# Patient Record
Sex: Female | Born: 1955 | Race: White | Hispanic: No | Marital: Married
Health system: Southern US, Community
[De-identification: ages and names within clinical notes are randomized; demographics above are authoritative.]

## PROBLEM LIST (undated history)

## (undated) HISTORY — PX: BREAST EXCISIONAL BIOPSY: SUR124

---

## 2001-04-20 ENCOUNTER — Encounter: Admission: RE | Admit: 2001-04-20 | Discharge: 2001-04-20 | Payer: Self-pay | Admitting: Gynecology

## 2001-04-20 ENCOUNTER — Encounter: Payer: Self-pay | Admitting: Gynecology

## 2003-03-17 ENCOUNTER — Other Ambulatory Visit: Admission: RE | Admit: 2003-03-17 | Discharge: 2003-03-17 | Payer: Self-pay | Admitting: Gynecology

## 2003-04-11 ENCOUNTER — Encounter: Admission: RE | Admit: 2003-04-11 | Discharge: 2003-04-11 | Payer: Self-pay | Admitting: Gynecology

## 2003-04-11 ENCOUNTER — Encounter: Payer: Self-pay | Admitting: Gynecology

## 2004-03-19 ENCOUNTER — Other Ambulatory Visit: Admission: RE | Admit: 2004-03-19 | Discharge: 2004-03-19 | Payer: Self-pay | Admitting: Gynecology

## 2004-04-20 ENCOUNTER — Encounter: Admission: RE | Admit: 2004-04-20 | Discharge: 2004-04-20 | Payer: Self-pay | Admitting: Gynecology

## 2004-04-26 ENCOUNTER — Encounter: Admission: RE | Admit: 2004-04-26 | Discharge: 2004-04-26 | Payer: Self-pay | Admitting: Gynecology

## 2004-10-24 ENCOUNTER — Encounter: Admission: RE | Admit: 2004-10-24 | Discharge: 2004-10-24 | Payer: Self-pay | Admitting: Gynecology

## 2005-04-23 ENCOUNTER — Encounter: Admission: RE | Admit: 2005-04-23 | Discharge: 2005-04-23 | Payer: Self-pay | Admitting: Gynecology

## 2005-06-04 ENCOUNTER — Other Ambulatory Visit: Admission: RE | Admit: 2005-06-04 | Discharge: 2005-06-04 | Payer: Self-pay | Admitting: Gynecology

## 2005-12-09 ENCOUNTER — Encounter: Admission: RE | Admit: 2005-12-09 | Discharge: 2005-12-09 | Payer: Self-pay | Admitting: Family Medicine

## 2006-01-09 ENCOUNTER — Encounter: Admission: RE | Admit: 2006-01-09 | Discharge: 2006-01-09 | Payer: Self-pay | Admitting: Family Medicine

## 2006-04-25 ENCOUNTER — Encounter: Admission: RE | Admit: 2006-04-25 | Discharge: 2006-04-25 | Payer: Self-pay | Admitting: Gynecology

## 2006-05-05 ENCOUNTER — Encounter: Admission: RE | Admit: 2006-05-05 | Discharge: 2006-05-05 | Payer: Self-pay | Admitting: Gynecology

## 2006-05-21 ENCOUNTER — Other Ambulatory Visit: Admission: RE | Admit: 2006-05-21 | Discharge: 2006-05-21 | Payer: Self-pay | Admitting: Gynecology

## 2006-08-15 ENCOUNTER — Encounter: Admission: RE | Admit: 2006-08-15 | Discharge: 2006-08-15 | Payer: Self-pay | Admitting: Family Medicine

## 2007-05-18 ENCOUNTER — Encounter: Admission: RE | Admit: 2007-05-18 | Discharge: 2007-05-18 | Payer: Self-pay | Admitting: Gynecology

## 2007-05-18 ENCOUNTER — Other Ambulatory Visit: Admission: RE | Admit: 2007-05-18 | Discharge: 2007-05-18 | Payer: Self-pay | Admitting: Gynecology

## 2008-04-22 ENCOUNTER — Encounter: Admission: RE | Admit: 2008-04-22 | Discharge: 2008-04-22 | Payer: Self-pay | Admitting: Family Medicine

## 2008-05-30 ENCOUNTER — Encounter: Admission: RE | Admit: 2008-05-30 | Discharge: 2008-05-30 | Payer: Self-pay | Admitting: Gynecology

## 2008-06-01 ENCOUNTER — Other Ambulatory Visit: Admission: RE | Admit: 2008-06-01 | Discharge: 2008-06-01 | Payer: Self-pay | Admitting: Gynecology

## 2008-06-08 ENCOUNTER — Encounter: Admission: RE | Admit: 2008-06-08 | Discharge: 2008-06-08 | Payer: Self-pay | Admitting: Gynecology

## 2008-08-08 ENCOUNTER — Encounter (INDEPENDENT_AMBULATORY_CARE_PROVIDER_SITE_OTHER): Payer: Self-pay | Admitting: Obstetrics and Gynecology

## 2008-08-08 ENCOUNTER — Ambulatory Visit (HOSPITAL_COMMUNITY): Admission: RE | Admit: 2008-08-08 | Discharge: 2008-08-08 | Payer: Self-pay | Admitting: Obstetrics and Gynecology

## 2009-06-14 ENCOUNTER — Encounter: Admission: RE | Admit: 2009-06-14 | Discharge: 2009-06-14 | Payer: Self-pay | Admitting: Gynecology

## 2009-08-09 ENCOUNTER — Encounter: Admission: RE | Admit: 2009-08-09 | Discharge: 2009-08-09 | Payer: Self-pay | Admitting: Gynecology

## 2010-06-28 ENCOUNTER — Encounter
Admission: RE | Admit: 2010-06-28 | Discharge: 2010-06-28 | Payer: Self-pay | Source: Home / Self Care | Admitting: Gynecology

## 2010-07-29 ENCOUNTER — Emergency Department (HOSPITAL_COMMUNITY)
Admission: EM | Admit: 2010-07-29 | Discharge: 2010-07-29 | Payer: Self-pay | Source: Home / Self Care | Admitting: Emergency Medicine

## 2010-08-06 LAB — CBC
HCT: 35.2 % — ABNORMAL LOW (ref 36.0–46.0)
Hemoglobin: 11.4 g/dL — ABNORMAL LOW (ref 12.0–15.0)
MCH: 24.7 pg — ABNORMAL LOW (ref 26.0–34.0)
MCHC: 32.4 g/dL (ref 30.0–36.0)
MCV: 76.4 fL — ABNORMAL LOW (ref 78.0–100.0)
Platelets: 308 10*3/uL (ref 150–400)
RBC: 4.61 MIL/uL (ref 3.87–5.11)
RDW: 14.8 % (ref 11.5–15.5)
WBC: 9.4 10*3/uL (ref 4.0–10.5)

## 2010-08-06 LAB — BASIC METABOLIC PANEL
BUN: 10 mg/dL (ref 6–23)
CO2: 26 mEq/L (ref 19–32)
Calcium: 8.4 mg/dL (ref 8.4–10.5)
Chloride: 107 mEq/L (ref 96–112)
Creatinine, Ser: 0.74 mg/dL (ref 0.4–1.2)
GFR calc Af Amer: >60 mL/min (ref >60)
GFR calc non Af Amer: >60 mL/min (ref >60)
Glucose, Bld: 110 mg/dL — ABNORMAL HIGH (ref 70–99)
Potassium: 4.1 mEq/L (ref 3.5–5.1)
Sodium: 137 mEq/L (ref 135–145)

## 2010-08-11 ENCOUNTER — Encounter: Payer: Self-pay | Admitting: Gynecology

## 2010-11-05 LAB — CBC
HCT: 33.3 % — ABNORMAL LOW (ref 36.0–46.0)
Hemoglobin: 11 g/dL — ABNORMAL LOW (ref 12.0–15.0)
MCHC: 32.9 g/dL (ref 30.0–36.0)
MCV: 79.1 fL (ref 78.0–100.0)
Platelets: 297 10*3/uL (ref 150–400)
RBC: 4.21 MIL/uL (ref 3.87–5.11)
RDW: 14.3 % (ref 11.5–15.5)
WBC: 7 10*3/uL (ref 4.0–10.5)

## 2010-12-04 NOTE — H&P (Signed)
NAME:  Carmen Hurst, Carmen Hurst             ACCOUNT NO.:  [id number]   MEDICAL RECORD NO.:  [id number]          PATIENT TYPE:  AMB   LOCATION:                                FACILITY:  [facility]   PHYSICIAN:  Duke Salvia. Marcelle Overlie, M.D.DATE OF BIRTH:  1955/10/22   DATE OF ADMISSION:  08/08/2008  DATE OF DISCHARGE:                              HISTORY & PHYSICAL   Date of scheduled surgery in 08/08/2008 at [facility].   CHIEF COMPLAINT:  Perimenopausal bleeding, endometrial polyps.   HISTORY OF PRESENT ILLNESS:  A 55 year old G2, P2, her husband has had a  vasectomy, who was evaluated recently by Dr. Teodora Medici with [facility] that  showed recurrence of endometrial polyps, which she had had removed by  [facility] previously.  She presents now for [facility] hysteroscopy.  She has  declined ablation at the same time.  This procedure including risks  related to bleeding, infection, perforation that may require additional  surgery, all reviewed with her, which she understands and accepts.   PAST MEDICAL HISTORY:  Two breast biopsies for benign disease, cesarean  section x2, rotator cuff surgery in 2007, hysteroscopy D&C in 2005.   Her PCP is Dr. Lupe Carney.   REVIEW OF SYSTEMS:  Significant for seasonal allergy.   FAMILY HISTORY:  Significant for both parents on oral control diabetes  treatment.   PHYSICAL EXAMINATION:  VITAL SIGNS:  Temperature 98.2, blood pressure  120/78.  HEENT: Unremarkable.  NECK:  Supple without masses.  LUNGS:  Clear.  CARDIOVASCULAR:  Regular rate and rhythm without murmurs, rubs, gallops.  BREASTS:  Without masses.  ABDOMEN:  Soft, flat, and nontender.  PELVIC:  Normal external genitalia.  High vaginal swab is clear.  Uterus  is mid in size.  Adnexa negative.  EXTREMITIES:  Unremarkable.  NEUROLOGIC:  Unremarkable.   IMPRESSION:  Recurrent endometrial polyps with abnormal uterine  bleeding.   PLAN:  D&C hysteroscopy procedure and risks reviewed as above.      Richard M.  Marcelle Overlie, M.D.  Electronically Signed     [facility]/MED[facility]  D:  08/03/2008  T:  08/03/2008  Job:  [id number]

## 2010-12-04 NOTE — Op Note (Signed)
NAME:  [name], Carmen Hurst             ACCOUNT NO.:  [id number]   MEDICAL RECORD NO.:  [id number]          PATIENT TYPE:  AMB   LOCATION:  [facility]                           FACILITY:  [facility]   PHYSICIAN:  Duke Salvia. Marcelle Overlie, M.D.DATE OF BIRTH:  March 22, 1956   DATE OF PROCEDURE:  DATE OF DISCHARGE:                               OPERATIVE REPORT   PREOPERATIVE DIAGNOSES:  Perimenopausal bleeding, endometrial polyps.   POSTOPERATIVE DIAGNOSES:  Perimenopausal bleeding, endometrial polyps.   PROCEDURE:  D and C and hysteroscopy.   SURGEON:  Duke Salvia. Marcelle Overlie, MD   ANESTHESIA:  General.   COMPLICATIONS:  None.   DRAINS:  Foley catheter.   BLOOD LOSS:  Minimal.   SPECIMENS REMOVED:  Endometrial curettings.   PROCEDURE AND FINDINGS:  The patient was then taken to the operating  room after an adequate level of general anesthesia was obtained with the  patient legs in stirrups, the perineum and vagina were prepped and  draped.  The bladder was drained.  EUA carried out.  Uterus was mid  position, upper limit of normal size.  Adnexa negative.  Speculum was  positioned.  Paracervical block created by infiltrating at 3 and 9  o'clock submucosally.  A 5 mL of 1% plain Xylocaine on either side after  negative aspiration.  Moderate cervical stenosis was noted.  This was  gradually dilated after sounding to 9 cm, dilated to a 27 Pratt, 7-mm  continuous flow.  Hysteroscope was then inserted.  Three to four  distinct benign-appearing polyps were noted on the right and anterior  uterine wall.  Sharp curettage was carried out.  A moderate amount of  tissue was removed.  Exploration with the polyp forceps revealed several  other polypoid pieces of tissue.  Scope was reinserted.  The cavity was  irrigated.  Repeat curettage for one area of some tissue buildup, this  allowed to the cavity to appear clean on followup examination.  All  tissue was submitted to Pathology.  She tolerated this well, went to  recovery room in good condition.      Richard M. Marcelle Overlie, M.D.  Electronically Signed     [facility]/MEDQ  D:  08/08/2008  T:  08/09/2008  Job:  [id number]

## 2011-07-02 ENCOUNTER — Other Ambulatory Visit: Payer: Self-pay | Admitting: Gynecology

## 2011-07-02 DIAGNOSIS — Z1231 Encounter for screening mammogram for malignant neoplasm of breast: Secondary | ICD-10-CM

## 2011-08-02 ENCOUNTER — Ambulatory Visit
Admission: RE | Admit: 2011-08-02 | Discharge: 2011-08-02 | Disposition: A | Discharge status: Home / Self Care | Payer: 59 | Source: Ambulatory Visit | Attending: Gynecology | Admitting: Gynecology

## 2011-08-02 DIAGNOSIS — Z1231 Encounter for screening mammogram for malignant neoplasm of breast: Secondary | ICD-10-CM

## 2011-08-26 ENCOUNTER — Other Ambulatory Visit: Payer: Self-pay | Admitting: Gynecology

## 2012-07-14 ENCOUNTER — Other Ambulatory Visit: Payer: Self-pay | Admitting: Gynecology

## 2012-07-14 DIAGNOSIS — Z1231 Encounter for screening mammogram for malignant neoplasm of breast: Secondary | ICD-10-CM

## 2012-08-21 ENCOUNTER — Ambulatory Visit
Admission: RE | Admit: 2012-08-21 | Discharge: 2012-08-21 | Disposition: A | Discharge status: Home / Self Care | Payer: 59 | Source: Ambulatory Visit | Attending: Gynecology | Admitting: Gynecology

## 2012-08-21 DIAGNOSIS — Z1231 Encounter for screening mammogram for malignant neoplasm of breast: Secondary | ICD-10-CM

## 2013-08-18 ENCOUNTER — Other Ambulatory Visit: Payer: Self-pay

## 2013-08-18 DIAGNOSIS — Z1231 Encounter for screening mammogram for malignant neoplasm of breast: Secondary | ICD-10-CM

## 2013-09-08 ENCOUNTER — Ambulatory Visit
Admission: RE | Admit: 2013-09-08 | Discharge: 2013-09-08 | Disposition: A | Discharge status: Home / Self Care | Payer: 59 | Source: Ambulatory Visit

## 2013-09-08 DIAGNOSIS — Z1231 Encounter for screening mammogram for malignant neoplasm of breast: Secondary | ICD-10-CM

## 2014-08-30 ENCOUNTER — Other Ambulatory Visit: Payer: Self-pay

## 2014-08-30 DIAGNOSIS — Z1231 Encounter for screening mammogram for malignant neoplasm of breast: Secondary | ICD-10-CM

## 2014-09-02 ENCOUNTER — Encounter (INDEPENDENT_AMBULATORY_CARE_PROVIDER_SITE_OTHER): Payer: Self-pay

## 2014-09-02 ENCOUNTER — Ambulatory Visit
Admission: RE | Admit: 2014-09-02 | Discharge: 2014-09-02 | Disposition: A | Discharge status: Home / Self Care | Payer: 59 | Source: Ambulatory Visit

## 2014-09-02 DIAGNOSIS — Z1231 Encounter for screening mammogram for malignant neoplasm of breast: Secondary | ICD-10-CM

## 2014-10-26 ENCOUNTER — Other Ambulatory Visit: Payer: Self-pay | Admitting: Gynecology

## 2014-10-27 LAB — CYTOLOGY - PAP

## 2015-08-08 ENCOUNTER — Other Ambulatory Visit: Payer: Self-pay

## 2015-08-08 DIAGNOSIS — Z1231 Encounter for screening mammogram for malignant neoplasm of breast: Secondary | ICD-10-CM

## 2015-09-04 ENCOUNTER — Ambulatory Visit
Admission: RE | Admit: 2015-09-04 | Discharge: 2015-09-04 | Disposition: A | Discharge status: Home / Self Care | Payer: 59 | Source: Ambulatory Visit

## 2015-09-04 DIAGNOSIS — Z1231 Encounter for screening mammogram for malignant neoplasm of breast: Secondary | ICD-10-CM

## 2016-07-29 DIAGNOSIS — J069 Acute upper respiratory infection, unspecified: Secondary | ICD-10-CM | POA: Diagnosis not present

## 2016-07-29 DIAGNOSIS — J45909 Unspecified asthma, uncomplicated: Secondary | ICD-10-CM | POA: Diagnosis not present

## 2016-12-19 ENCOUNTER — Other Ambulatory Visit: Payer: Self-pay | Admitting: Obstetrics and Gynecology

## 2016-12-19 DIAGNOSIS — Z1231 Encounter for screening mammogram for malignant neoplasm of breast: Secondary | ICD-10-CM

## 2017-01-02 ENCOUNTER — Ambulatory Visit
Admission: RE | Admit: 2017-01-02 | Discharge: 2017-01-02 | Disposition: A | Discharge status: Home / Self Care | Payer: 59 | Source: Ambulatory Visit | Attending: Obstetrics and Gynecology | Admitting: Obstetrics and Gynecology

## 2017-01-02 DIAGNOSIS — Z1231 Encounter for screening mammogram for malignant neoplasm of breast: Secondary | ICD-10-CM | POA: Diagnosis not present

## 2017-03-20 DIAGNOSIS — H5213 Myopia, bilateral: Secondary | ICD-10-CM | POA: Diagnosis not present

## 2017-04-03 DIAGNOSIS — Z01419 Encounter for gynecological examination (general) (routine) without abnormal findings: Secondary | ICD-10-CM | POA: Diagnosis not present

## 2017-04-09 DIAGNOSIS — Z1329 Encounter for screening for other suspected endocrine disorder: Secondary | ICD-10-CM | POA: Diagnosis not present

## 2017-04-09 DIAGNOSIS — Z13228 Encounter for screening for other metabolic disorders: Secondary | ICD-10-CM | POA: Diagnosis not present

## 2017-04-09 DIAGNOSIS — Z13220 Encounter for screening for lipoid disorders: Secondary | ICD-10-CM | POA: Diagnosis not present

## 2017-12-25 ENCOUNTER — Other Ambulatory Visit: Payer: Self-pay | Admitting: Obstetrics and Gynecology

## 2017-12-25 DIAGNOSIS — Z1231 Encounter for screening mammogram for malignant neoplasm of breast: Secondary | ICD-10-CM

## 2018-02-12 ENCOUNTER — Ambulatory Visit
Admission: RE | Admit: 2018-02-12 | Discharge: 2018-02-12 | Disposition: A | Discharge status: Home / Self Care | Payer: 59 | Source: Ambulatory Visit | Attending: Obstetrics and Gynecology | Admitting: Obstetrics and Gynecology

## 2018-02-12 DIAGNOSIS — Z1231 Encounter for screening mammogram for malignant neoplasm of breast: Secondary | ICD-10-CM

## 2018-04-08 DIAGNOSIS — Z6839 Body mass index (BMI) 39.0-39.9, adult: Secondary | ICD-10-CM | POA: Diagnosis not present

## 2018-04-08 DIAGNOSIS — Z01419 Encounter for gynecological examination (general) (routine) without abnormal findings: Secondary | ICD-10-CM | POA: Diagnosis not present

## 2018-05-25 DIAGNOSIS — S93602A Unspecified sprain of left foot, initial encounter: Secondary | ICD-10-CM | POA: Diagnosis not present

## 2018-06-25 DIAGNOSIS — S93602D Unspecified sprain of left foot, subsequent encounter: Secondary | ICD-10-CM | POA: Diagnosis not present

## 2018-06-30 DIAGNOSIS — M79672 Pain in left foot: Secondary | ICD-10-CM | POA: Diagnosis not present

## 2018-06-30 DIAGNOSIS — M25572 Pain in left ankle and joints of left foot: Secondary | ICD-10-CM | POA: Diagnosis not present

## 2018-07-02 DIAGNOSIS — M25572 Pain in left ankle and joints of left foot: Secondary | ICD-10-CM | POA: Diagnosis not present

## 2018-07-06 DIAGNOSIS — M19072 Primary osteoarthritis, left ankle and foot: Secondary | ICD-10-CM | POA: Diagnosis not present

## 2018-07-06 DIAGNOSIS — S93402A Sprain of unspecified ligament of left ankle, initial encounter: Secondary | ICD-10-CM | POA: Diagnosis not present

## 2018-07-14 DIAGNOSIS — S93402D Sprain of unspecified ligament of left ankle, subsequent encounter: Secondary | ICD-10-CM | POA: Diagnosis not present

## 2019-03-10 ENCOUNTER — Other Ambulatory Visit: Payer: Self-pay | Admitting: Obstetrics and Gynecology

## 2019-03-10 DIAGNOSIS — Z1231 Encounter for screening mammogram for malignant neoplasm of breast: Secondary | ICD-10-CM

## 2019-04-27 ENCOUNTER — Ambulatory Visit: Payer: 59

## 2019-05-24 ENCOUNTER — Other Ambulatory Visit: Payer: Self-pay

## 2019-05-24 ENCOUNTER — Ambulatory Visit
Admission: RE | Admit: 2019-05-24 | Discharge: 2019-05-24 | Disposition: A | Discharge status: Home / Self Care | Payer: BC Managed Care – PPO | Source: Ambulatory Visit | Attending: Obstetrics and Gynecology | Admitting: Obstetrics and Gynecology

## 2019-05-24 DIAGNOSIS — Z1231 Encounter for screening mammogram for malignant neoplasm of breast: Secondary | ICD-10-CM

## 2019-10-14 ENCOUNTER — Ambulatory Visit: Payer: BC Managed Care – PPO | Attending: Internal Medicine

## 2019-10-14 DIAGNOSIS — Z23 Encounter for immunization: Secondary | ICD-10-CM

## 2019-10-14 NOTE — Progress Notes (Signed)
   Covid-19 Vaccination Clinic  Name:  Carmen Hurst    MRN: [id number] DOB: 20-Jul-1956  10/14/2019  Carmen Hurst was observed post Covid-19 immunization for 15 minutes without incident. She was provided with Vaccine Information Sheet and instruction to access the V-Safe system.   Carmen Hurst was instructed to call 911 with any severe reactions post vaccine: Marland Kitchen Difficulty breathing  . Swelling of face and throat  . A fast heartbeat  . A bad rash all over body  . Dizziness and weakness   Immunizations Administered    Name Date Dose VIS Date Route   Pfizer COVID-19 Vaccine 10/14/2019  9:57 AM 0.3 mL 07/02/2019 Intramuscular   Manufacturer: [employer], [employer]   Lot: [id number]   NDC: [address]-5

## 2019-11-10 ENCOUNTER — Ambulatory Visit: Payer: BC Managed Care – PPO | Attending: Internal Medicine

## 2019-11-10 DIAGNOSIS — Z23 Encounter for immunization: Secondary | ICD-10-CM

## 2019-11-10 NOTE — Progress Notes (Signed)
   Covid-19 Vaccination Clinic  Name:  Carmen Hurst    MRN: [id number] DOB: 1955-09-04  11/10/2019  [name] was observed post Covid-19 immunization for 15 minutes without incident. She was provided with Vaccine Information Sheet and instruction to access the V-Safe system.   [name] was instructed to call 911 with any severe reactions post vaccine: Marland Kitchen Difficulty breathing  . Swelling of face and throat  . A fast heartbeat  . A bad rash all over body  . Dizziness and weakness   Immunizations Administered    Name Date Dose VIS Date Route   Pfizer COVID-19 Vaccine 11/10/2019  9:57 AM 0.3 mL 09/15/2018 Intramuscular   Manufacturer: [employer], [employer]   Lot: [id number]   NDC: [address]-4

## 2020-04-27 ENCOUNTER — Other Ambulatory Visit: Payer: Self-pay | Admitting: Obstetrics and Gynecology

## 2020-04-27 DIAGNOSIS — Z1231 Encounter for screening mammogram for malignant neoplasm of breast: Secondary | ICD-10-CM

## 2020-05-25 ENCOUNTER — Ambulatory Visit: Payer: BC Managed Care – PPO

## 2020-08-03 ENCOUNTER — Other Ambulatory Visit: Payer: Self-pay

## 2020-08-03 ENCOUNTER — Ambulatory Visit
Admission: RE | Admit: 2020-08-03 | Discharge: 2020-08-03 | Disposition: A | Discharge status: Home / Self Care | Payer: BC Managed Care – PPO | Source: Ambulatory Visit | Attending: Obstetrics and Gynecology | Admitting: Obstetrics and Gynecology

## 2020-08-03 DIAGNOSIS — Z1231 Encounter for screening mammogram for malignant neoplasm of breast: Secondary | ICD-10-CM

## 2021-07-09 ENCOUNTER — Other Ambulatory Visit: Payer: Self-pay | Admitting: Obstetrics and Gynecology

## 2021-07-09 DIAGNOSIS — Z1231 Encounter for screening mammogram for malignant neoplasm of breast: Secondary | ICD-10-CM

## 2021-08-07 ENCOUNTER — Ambulatory Visit
Admission: RE | Admit: 2021-08-07 | Discharge: 2021-08-07 | Disposition: A | Discharge status: Home / Self Care | Payer: BC Managed Care – PPO | Source: Ambulatory Visit | Attending: Obstetrics and Gynecology | Admitting: Obstetrics and Gynecology

## 2021-08-07 DIAGNOSIS — Z1231 Encounter for screening mammogram for malignant neoplasm of breast: Secondary | ICD-10-CM

## 2022-06-26 DIAGNOSIS — Z01419 Encounter for gynecological examination (general) (routine) without abnormal findings: Secondary | ICD-10-CM | POA: Diagnosis not present

## 2022-06-26 DIAGNOSIS — Z6841 Body Mass Index (BMI) 40.0 and over, adult: Secondary | ICD-10-CM | POA: Diagnosis not present

## 2022-07-09 DIAGNOSIS — M545 Low back pain, unspecified: Secondary | ICD-10-CM | POA: Diagnosis not present

## 2022-07-09 DIAGNOSIS — M25551 Pain in right hip: Secondary | ICD-10-CM | POA: Diagnosis not present

## 2022-07-18 ENCOUNTER — Other Ambulatory Visit: Payer: Self-pay | Admitting: Obstetrics and Gynecology

## 2022-07-18 DIAGNOSIS — Z1231 Encounter for screening mammogram for malignant neoplasm of breast: Secondary | ICD-10-CM

## 2022-07-23 DIAGNOSIS — M25551 Pain in right hip: Secondary | ICD-10-CM | POA: Diagnosis not present

## 2022-07-26 DIAGNOSIS — M25551 Pain in right hip: Secondary | ICD-10-CM | POA: Diagnosis not present

## 2022-07-30 DIAGNOSIS — M25551 Pain in right hip: Secondary | ICD-10-CM | POA: Diagnosis not present

## 2022-08-04 IMAGING — MG MM DIGITAL SCREENING BILAT W/ TOMO AND CAD
6 of 10 series · 6 of 30 positions shown · non-contrast
Comparison: Previous exam(s).

CLINICAL DATA: Screening.

EXAM:
DIGITAL SCREENING BILATERAL MAMMOGRAM WITH TOMOSYNTHESIS AND CAD
TECHNIQUE: Bilateral screening digital craniocaudal and mediolateral oblique
mammograms were obtained. Bilateral screening digital breast
tomosynthesis was performed. The images were evaluated with
computer-aided detection.

[L CC synth-2D]
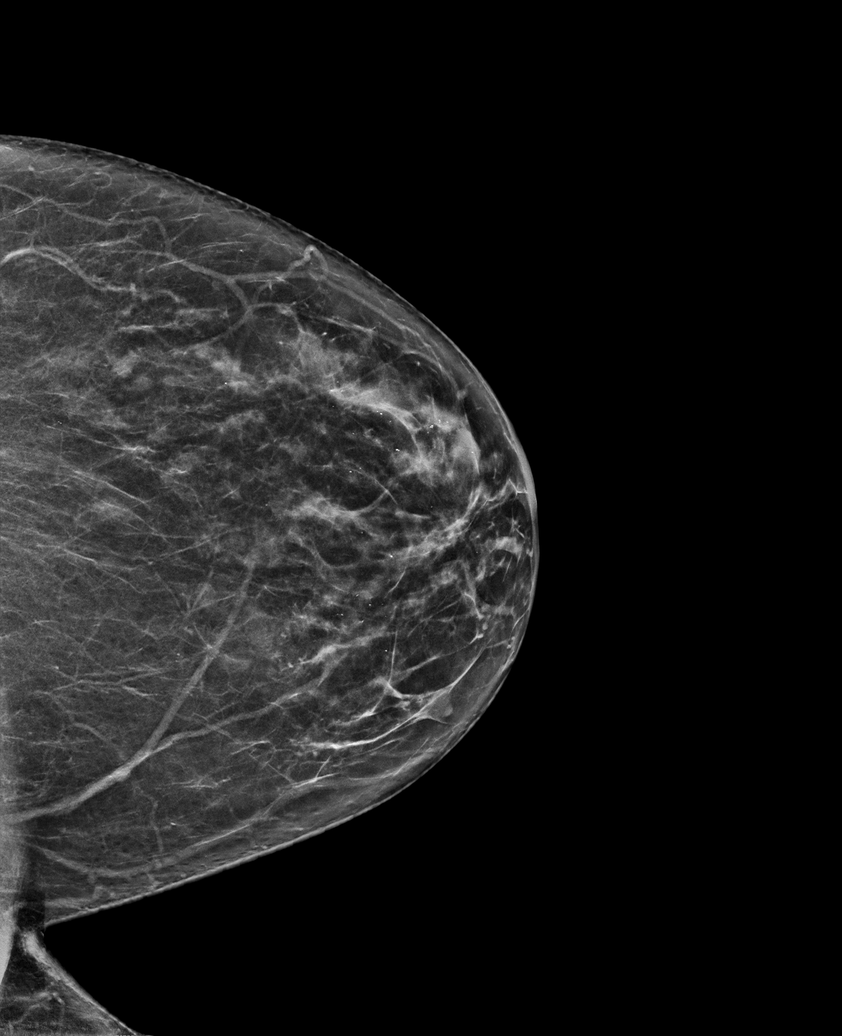

[L MLO synth-2D]
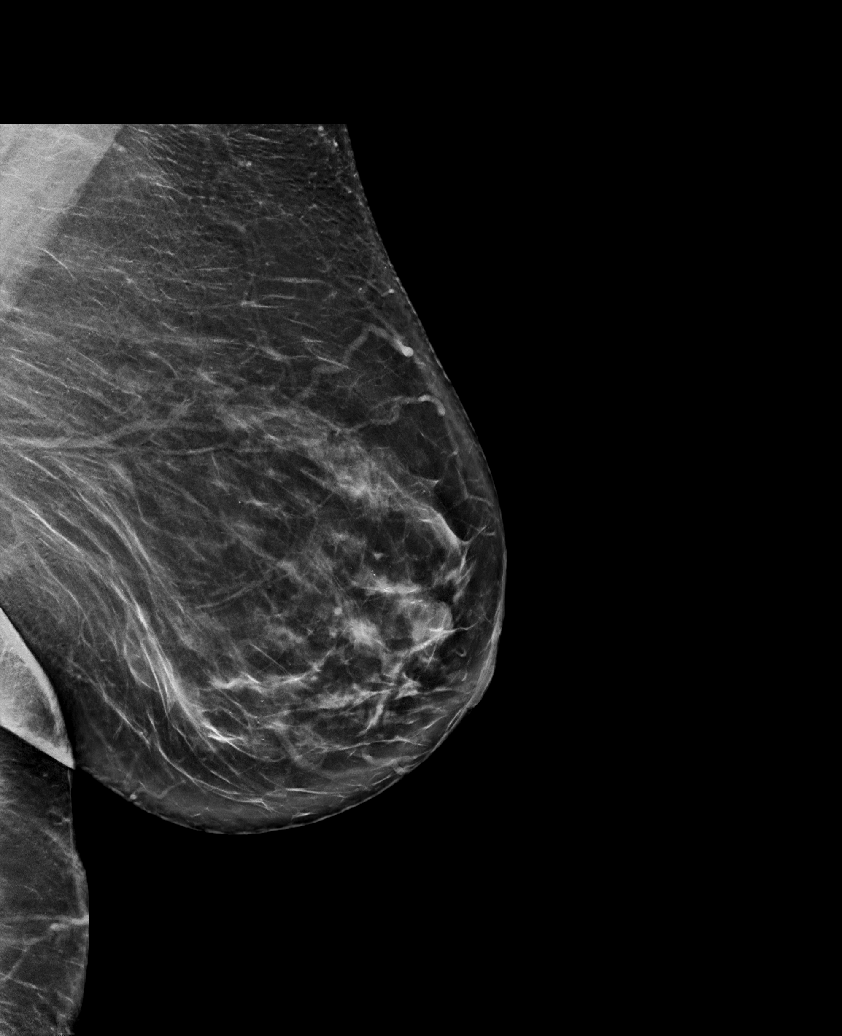

[R MLO synth-2D (1 of 2)]
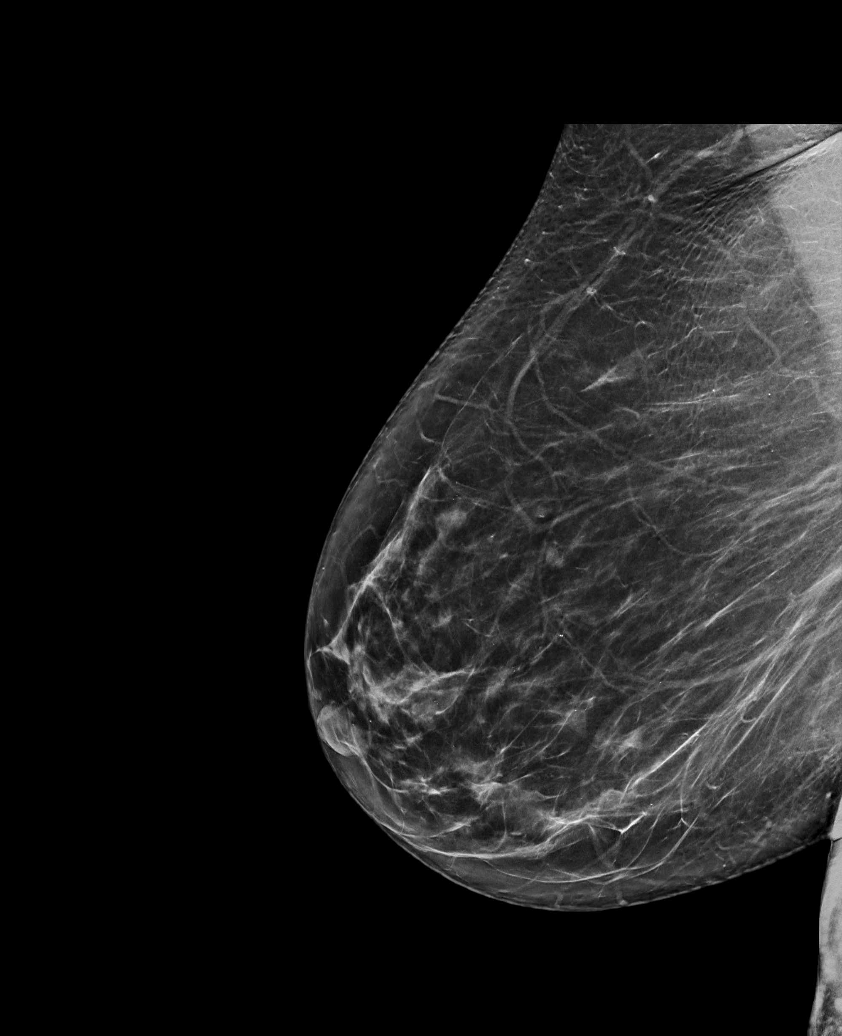

[R MLO synth-2D (2 of 2)]
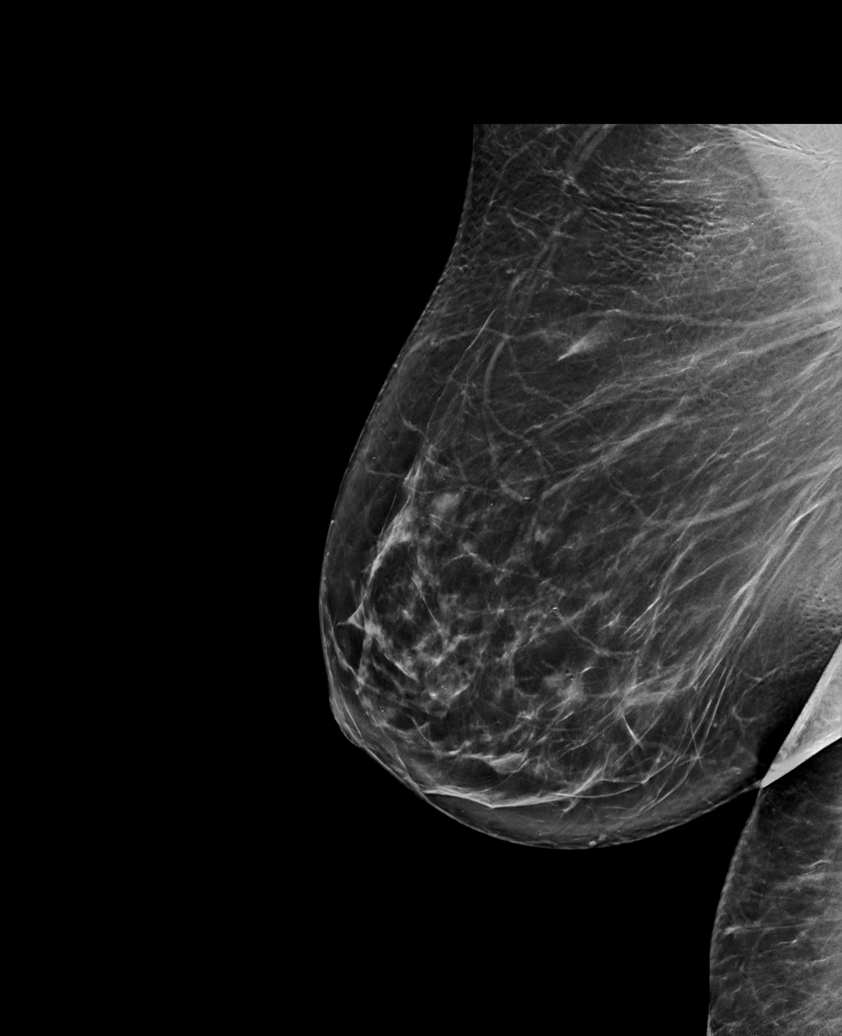

[R CC synth-2D]
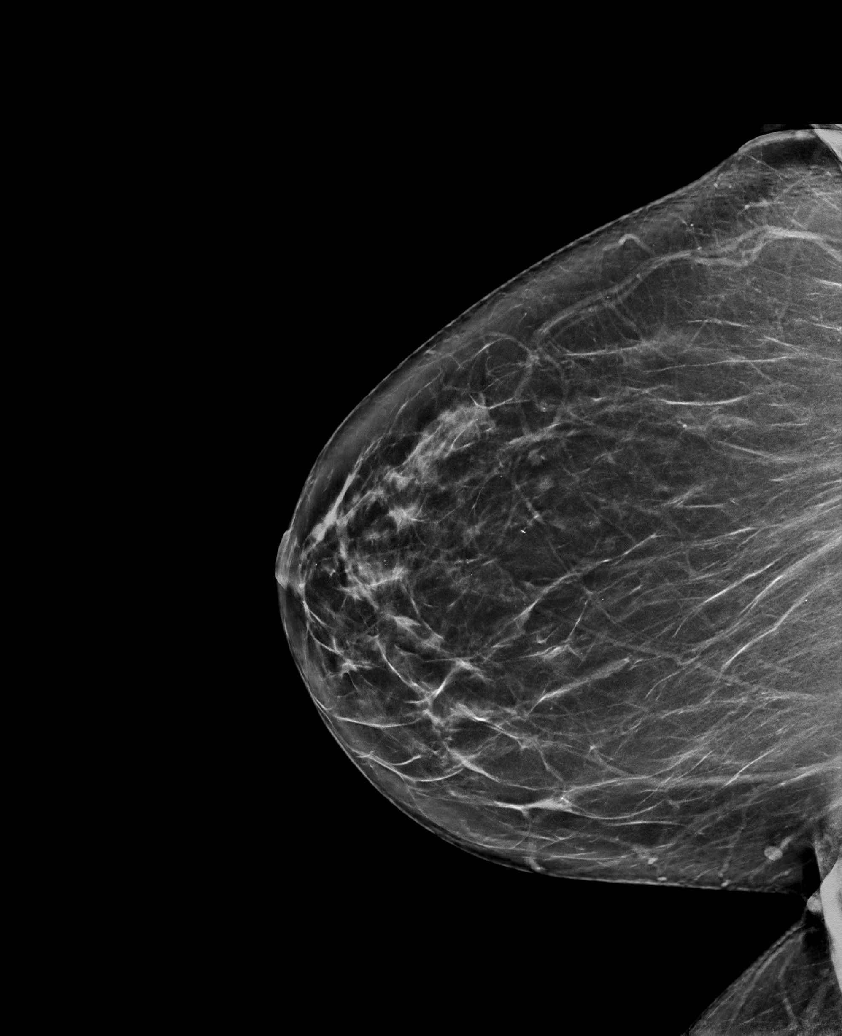

[L CC tomo · tomo slice 33/65.0]
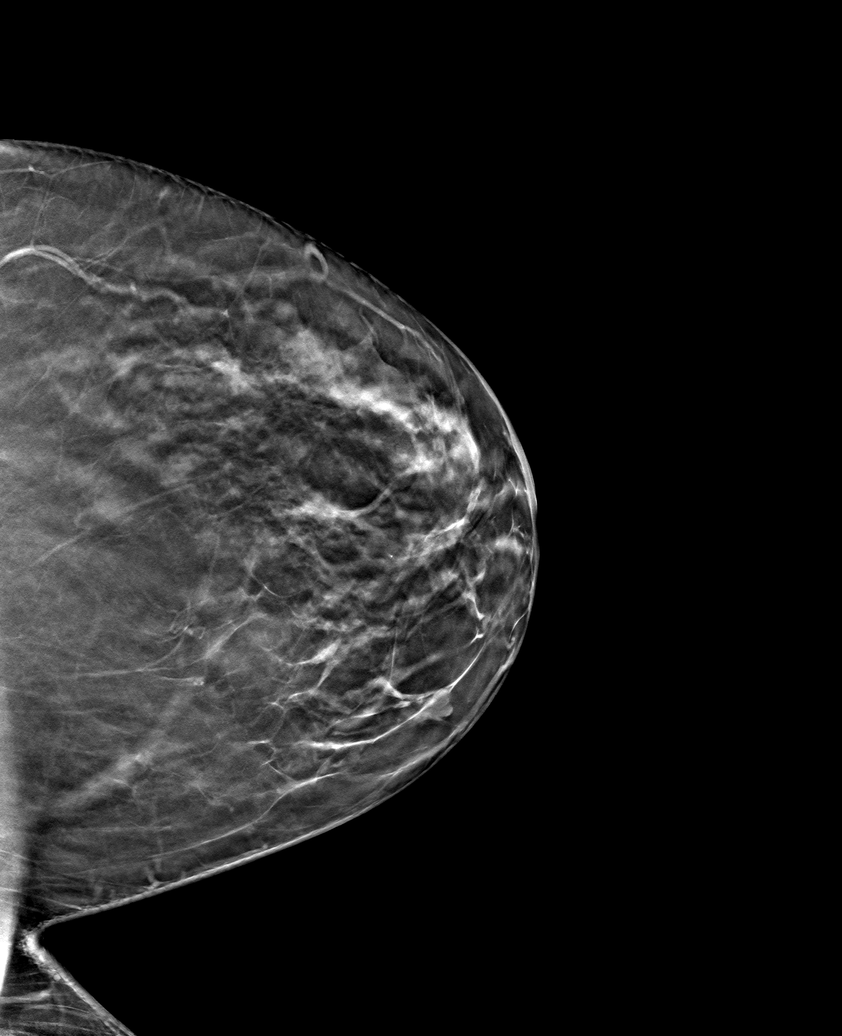

[6 of 30 positions shown; findings below may reference images not displayed]

ACR Breast Density Category b: There are scattered areas of
fibroglandular density.
FINDINGS: There are no findings suspicious for malignancy.
IMPRESSION: No mammographic evidence of malignancy. A result letter of this
screening mammogram will be mailed directly to the patient.

RECOMMENDATION:
Screening mammogram in one year. (Code:51-O-LD2)

BI-RADS CATEGORY  1: Negative.

## 2022-08-30 DIAGNOSIS — M1611 Unilateral primary osteoarthritis, right hip: Secondary | ICD-10-CM | POA: Diagnosis not present

## 2022-08-30 DIAGNOSIS — M25551 Pain in right hip: Secondary | ICD-10-CM | POA: Diagnosis not present

## 2022-09-06 DIAGNOSIS — M25551 Pain in right hip: Secondary | ICD-10-CM | POA: Diagnosis not present

## 2022-09-06 DIAGNOSIS — E669 Obesity, unspecified: Secondary | ICD-10-CM | POA: Diagnosis not present

## 2022-09-06 DIAGNOSIS — I10 Essential (primary) hypertension: Secondary | ICD-10-CM | POA: Diagnosis not present

## 2022-09-10 ENCOUNTER — Ambulatory Visit
Admission: RE | Admit: 2022-09-10 | Discharge: 2022-09-10 | Disposition: A | Discharge status: Home / Self Care | Payer: Medicare HMO | Source: Ambulatory Visit | Attending: Obstetrics and Gynecology | Admitting: Obstetrics and Gynecology

## 2022-09-10 DIAGNOSIS — Z1231 Encounter for screening mammogram for malignant neoplasm of breast: Secondary | ICD-10-CM

## 2022-09-13 ENCOUNTER — Other Ambulatory Visit: Payer: Self-pay | Admitting: Obstetrics and Gynecology

## 2022-09-13 DIAGNOSIS — R928 Other abnormal and inconclusive findings on diagnostic imaging of breast: Secondary | ICD-10-CM

## 2022-09-27 ENCOUNTER — Ambulatory Visit
Admission: RE | Admit: 2022-09-27 | Discharge: 2022-09-27 | Disposition: A | Discharge status: Home / Self Care | Payer: Medicare HMO | Source: Ambulatory Visit | Attending: Obstetrics and Gynecology | Admitting: Obstetrics and Gynecology

## 2022-09-27 DIAGNOSIS — R928 Other abnormal and inconclusive findings on diagnostic imaging of breast: Secondary | ICD-10-CM

## 2022-09-27 DIAGNOSIS — N6012 Diffuse cystic mastopathy of left breast: Secondary | ICD-10-CM | POA: Diagnosis not present

## 2022-10-03 DIAGNOSIS — Z13820 Encounter for screening for osteoporosis: Secondary | ICD-10-CM | POA: Diagnosis not present

## 2022-10-03 DIAGNOSIS — Z0000 Encounter for general adult medical examination without abnormal findings: Secondary | ICD-10-CM | POA: Diagnosis not present

## 2022-10-03 DIAGNOSIS — Z1159 Encounter for screening for other viral diseases: Secondary | ICD-10-CM | POA: Diagnosis not present

## 2022-10-03 DIAGNOSIS — Z23 Encounter for immunization: Secondary | ICD-10-CM | POA: Diagnosis not present

## 2022-10-03 DIAGNOSIS — J45909 Unspecified asthma, uncomplicated: Secondary | ICD-10-CM | POA: Diagnosis not present

## 2022-10-03 DIAGNOSIS — Z1239 Encounter for other screening for malignant neoplasm of breast: Secondary | ICD-10-CM | POA: Diagnosis not present

## 2022-10-03 DIAGNOSIS — E669 Obesity, unspecified: Secondary | ICD-10-CM | POA: Diagnosis not present

## 2022-10-03 DIAGNOSIS — Z1211 Encounter for screening for malignant neoplasm of colon: Secondary | ICD-10-CM | POA: Diagnosis not present

## 2022-10-03 DIAGNOSIS — I10 Essential (primary) hypertension: Secondary | ICD-10-CM | POA: Diagnosis not present

## 2022-10-08 DIAGNOSIS — M25551 Pain in right hip: Secondary | ICD-10-CM | POA: Diagnosis not present

## 2022-10-16 DIAGNOSIS — M25519 Pain in unspecified shoulder: Secondary | ICD-10-CM | POA: Diagnosis not present

## 2022-10-16 DIAGNOSIS — M25551 Pain in right hip: Secondary | ICD-10-CM | POA: Diagnosis not present

## 2022-10-16 DIAGNOSIS — Z01812 Encounter for preprocedural laboratory examination: Secondary | ICD-10-CM | POA: Diagnosis not present

## 2022-10-16 DIAGNOSIS — M1611 Unilateral primary osteoarthritis, right hip: Secondary | ICD-10-CM | POA: Diagnosis not present

## 2022-11-12 DIAGNOSIS — M1611 Unilateral primary osteoarthritis, right hip: Secondary | ICD-10-CM | POA: Diagnosis not present

## 2022-12-04 DIAGNOSIS — M1611 Unilateral primary osteoarthritis, right hip: Secondary | ICD-10-CM | POA: Diagnosis not present

## 2022-12-09 DIAGNOSIS — R262 Difficulty in walking, not elsewhere classified: Secondary | ICD-10-CM | POA: Diagnosis not present

## 2022-12-09 DIAGNOSIS — M6281 Muscle weakness (generalized): Secondary | ICD-10-CM | POA: Diagnosis not present

## 2022-12-09 DIAGNOSIS — M1611 Unilateral primary osteoarthritis, right hip: Secondary | ICD-10-CM | POA: Diagnosis not present

## 2022-12-11 DIAGNOSIS — M6281 Muscle weakness (generalized): Secondary | ICD-10-CM | POA: Diagnosis not present

## 2022-12-11 DIAGNOSIS — M1611 Unilateral primary osteoarthritis, right hip: Secondary | ICD-10-CM | POA: Diagnosis not present

## 2022-12-11 DIAGNOSIS — R262 Difficulty in walking, not elsewhere classified: Secondary | ICD-10-CM | POA: Diagnosis not present

## 2022-12-18 DIAGNOSIS — R262 Difficulty in walking, not elsewhere classified: Secondary | ICD-10-CM | POA: Diagnosis not present

## 2022-12-18 DIAGNOSIS — M6281 Muscle weakness (generalized): Secondary | ICD-10-CM | POA: Diagnosis not present

## 2022-12-18 DIAGNOSIS — M1611 Unilateral primary osteoarthritis, right hip: Secondary | ICD-10-CM | POA: Diagnosis not present

## 2022-12-19 DIAGNOSIS — M1611 Unilateral primary osteoarthritis, right hip: Secondary | ICD-10-CM | POA: Diagnosis not present

## 2022-12-20 DIAGNOSIS — M1611 Unilateral primary osteoarthritis, right hip: Secondary | ICD-10-CM | POA: Diagnosis not present

## 2022-12-20 DIAGNOSIS — M6281 Muscle weakness (generalized): Secondary | ICD-10-CM | POA: Diagnosis not present

## 2022-12-20 DIAGNOSIS — R262 Difficulty in walking, not elsewhere classified: Secondary | ICD-10-CM | POA: Diagnosis not present

## 2022-12-24 DIAGNOSIS — M6281 Muscle weakness (generalized): Secondary | ICD-10-CM | POA: Diagnosis not present

## 2022-12-24 DIAGNOSIS — R262 Difficulty in walking, not elsewhere classified: Secondary | ICD-10-CM | POA: Diagnosis not present

## 2022-12-24 DIAGNOSIS — M1611 Unilateral primary osteoarthritis, right hip: Secondary | ICD-10-CM | POA: Diagnosis not present

## 2022-12-26 DIAGNOSIS — M1611 Unilateral primary osteoarthritis, right hip: Secondary | ICD-10-CM | POA: Diagnosis not present

## 2022-12-26 DIAGNOSIS — M6281 Muscle weakness (generalized): Secondary | ICD-10-CM | POA: Diagnosis not present

## 2022-12-26 DIAGNOSIS — R262 Difficulty in walking, not elsewhere classified: Secondary | ICD-10-CM | POA: Diagnosis not present

## 2022-12-31 DIAGNOSIS — M1611 Unilateral primary osteoarthritis, right hip: Secondary | ICD-10-CM | POA: Diagnosis not present

## 2022-12-31 DIAGNOSIS — R262 Difficulty in walking, not elsewhere classified: Secondary | ICD-10-CM | POA: Diagnosis not present

## 2022-12-31 DIAGNOSIS — M6281 Muscle weakness (generalized): Secondary | ICD-10-CM | POA: Diagnosis not present

## 2023-01-02 DIAGNOSIS — M6281 Muscle weakness (generalized): Secondary | ICD-10-CM | POA: Diagnosis not present

## 2023-01-02 DIAGNOSIS — M1611 Unilateral primary osteoarthritis, right hip: Secondary | ICD-10-CM | POA: Diagnosis not present

## 2023-01-02 DIAGNOSIS — R262 Difficulty in walking, not elsewhere classified: Secondary | ICD-10-CM | POA: Diagnosis not present

## 2023-01-16 DIAGNOSIS — M1611 Unilateral primary osteoarthritis, right hip: Secondary | ICD-10-CM | POA: Diagnosis not present

## 2023-03-04 DIAGNOSIS — M545 Low back pain, unspecified: Secondary | ICD-10-CM | POA: Diagnosis not present

## 2023-03-04 DIAGNOSIS — M1611 Unilateral primary osteoarthritis, right hip: Secondary | ICD-10-CM | POA: Diagnosis not present

## 2023-03-13 DIAGNOSIS — M545 Low back pain, unspecified: Secondary | ICD-10-CM | POA: Diagnosis not present

## 2023-04-22 DIAGNOSIS — M545 Low back pain, unspecified: Secondary | ICD-10-CM | POA: Diagnosis not present

## 2023-08-06 LAB — EXTERNAL GENERIC LAB PROCEDURE: COLOGUARD: NEGATIVE

## 2023-09-12 ENCOUNTER — Other Ambulatory Visit: Payer: Self-pay | Admitting: Obstetrics and Gynecology

## 2023-09-12 DIAGNOSIS — Z1231 Encounter for screening mammogram for malignant neoplasm of breast: Secondary | ICD-10-CM

## 2023-09-30 ENCOUNTER — Ambulatory Visit
Admission: RE | Admit: 2023-09-30 | Discharge: 2023-09-30 | Disposition: A | Discharge status: Home / Self Care | Payer: Medicare HMO | Source: Ambulatory Visit | Attending: Obstetrics and Gynecology | Admitting: Obstetrics and Gynecology

## 2023-09-30 DIAGNOSIS — Z1231 Encounter for screening mammogram for malignant neoplasm of breast: Secondary | ICD-10-CM
